# Patient Record
Sex: Male | Born: 1980 | Marital: Married | Smoking: Current every day smoker
Health system: Southern US, Community
[De-identification: ages and names within clinical notes are randomized; demographics above are authoritative.]

## PROBLEM LIST (undated history)

## (undated) DIAGNOSIS — I10 Essential (primary) hypertension: Secondary | ICD-10-CM

## (undated) DIAGNOSIS — G43909 Migraine, unspecified, not intractable, without status migrainosus: Secondary | ICD-10-CM

## (undated) DIAGNOSIS — F319 Bipolar disorder, unspecified: Secondary | ICD-10-CM

## (undated) DIAGNOSIS — F419 Anxiety disorder, unspecified: Secondary | ICD-10-CM

## (undated) HISTORY — DX: Migraine, unspecified, not intractable, without status migrainosus: G43.909

## (undated) HISTORY — DX: Anxiety disorder, unspecified: F41.9

## (undated) HISTORY — DX: Essential (primary) hypertension: I10

## (undated) HISTORY — DX: Bipolar disorder, unspecified: F31.9

---

## 2010-02-15 ENCOUNTER — Ambulatory Visit: Payer: Self-pay | Admitting: Family Medicine

## 2010-02-22 ENCOUNTER — Ambulatory Visit: Payer: Self-pay | Admitting: Family Medicine

## 2013-02-25 ENCOUNTER — Ambulatory Visit: Payer: Self-pay

## 2013-03-11 DIAGNOSIS — I10 Essential (primary) hypertension: Secondary | ICD-10-CM | POA: Insufficient documentation

## 2013-03-11 DIAGNOSIS — G43909 Migraine, unspecified, not intractable, without status migrainosus: Secondary | ICD-10-CM | POA: Insufficient documentation

## 2013-03-11 DIAGNOSIS — F319 Bipolar disorder, unspecified: Secondary | ICD-10-CM | POA: Insufficient documentation

## 2013-04-23 ENCOUNTER — Ambulatory Visit: Payer: Self-pay | Admitting: Family Medicine

## 2014-01-17 ENCOUNTER — Ambulatory Visit: Payer: Self-pay | Admitting: Physician Assistant

## 2014-05-09 ENCOUNTER — Ambulatory Visit: Payer: Self-pay | Admitting: Physician Assistant

## 2014-08-16 ENCOUNTER — Ambulatory Visit: Payer: Self-pay | Admitting: Internal Medicine

## 2014-09-01 ENCOUNTER — Ambulatory Visit: Payer: Self-pay | Admitting: Emergency Medicine

## 2014-12-20 ENCOUNTER — Ambulatory Visit: Payer: Self-pay

## 2014-12-20 DIAGNOSIS — G8929 Other chronic pain: Secondary | ICD-10-CM | POA: Insufficient documentation

## 2014-12-20 DIAGNOSIS — M549 Dorsalgia, unspecified: Principal | ICD-10-CM

## 2014-12-28 ENCOUNTER — Institutional Professional Consult (permissible substitution): Payer: Self-pay | Admitting: Specialist

## 2015-01-03 ENCOUNTER — Ambulatory Visit: Payer: Self-pay

## 2015-01-12 ENCOUNTER — Ambulatory Visit: Payer: Self-pay | Admitting: Licensed Clinical Social Worker

## 2015-03-16 IMAGING — CR DG RIBS 2V*R*
4 series · 4 of 4 positions shown · non-contrast
Comparison: None.

CLINICAL DATA: Fell down stairs

EXAM:
RIGHT RIBS - 2 VIEW

[chest pa]
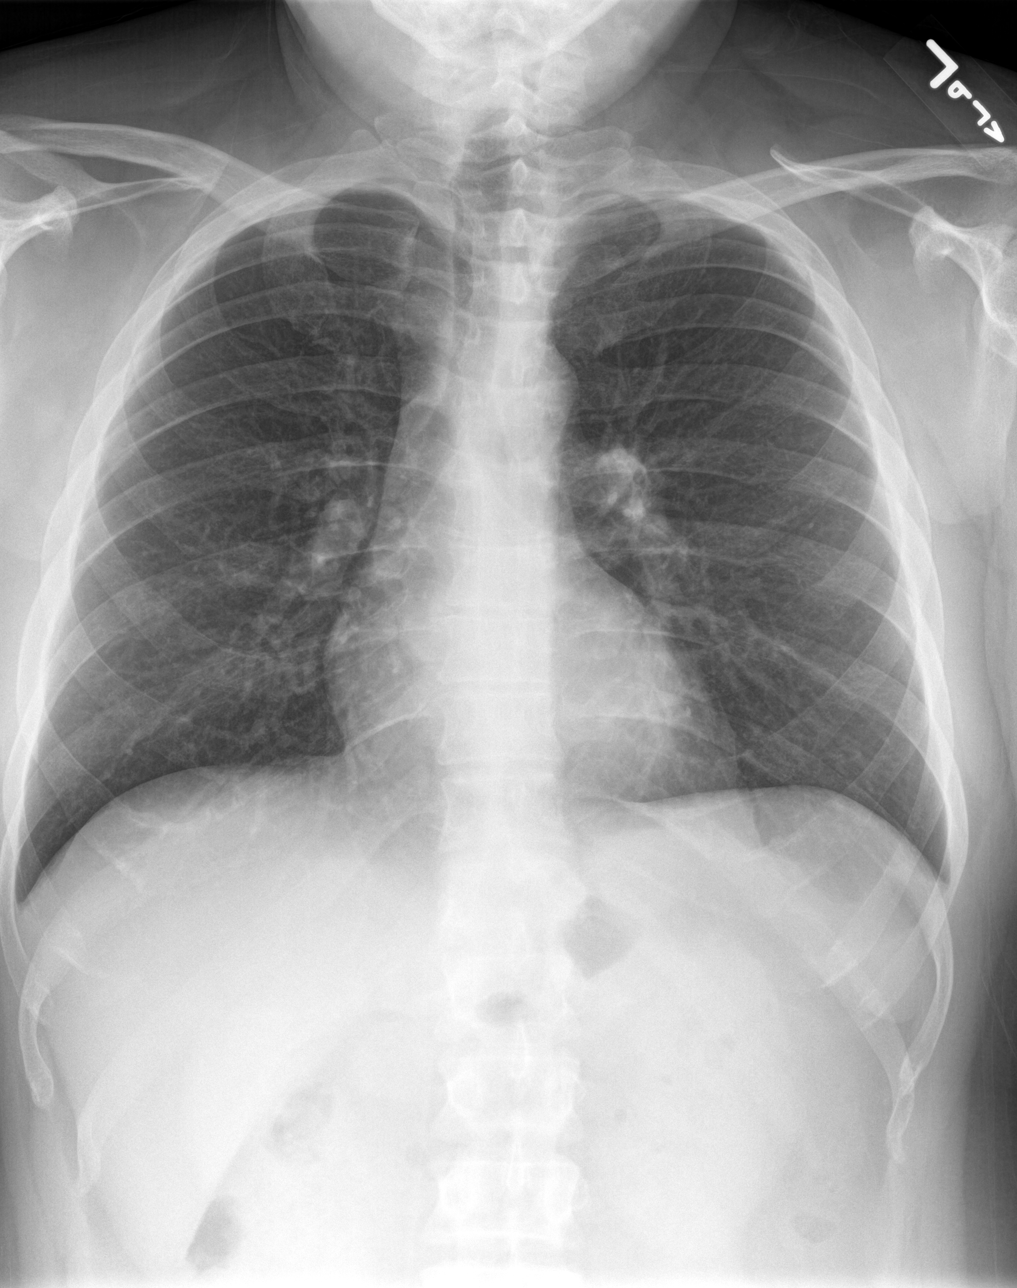

[rib ap (1 of 2)]
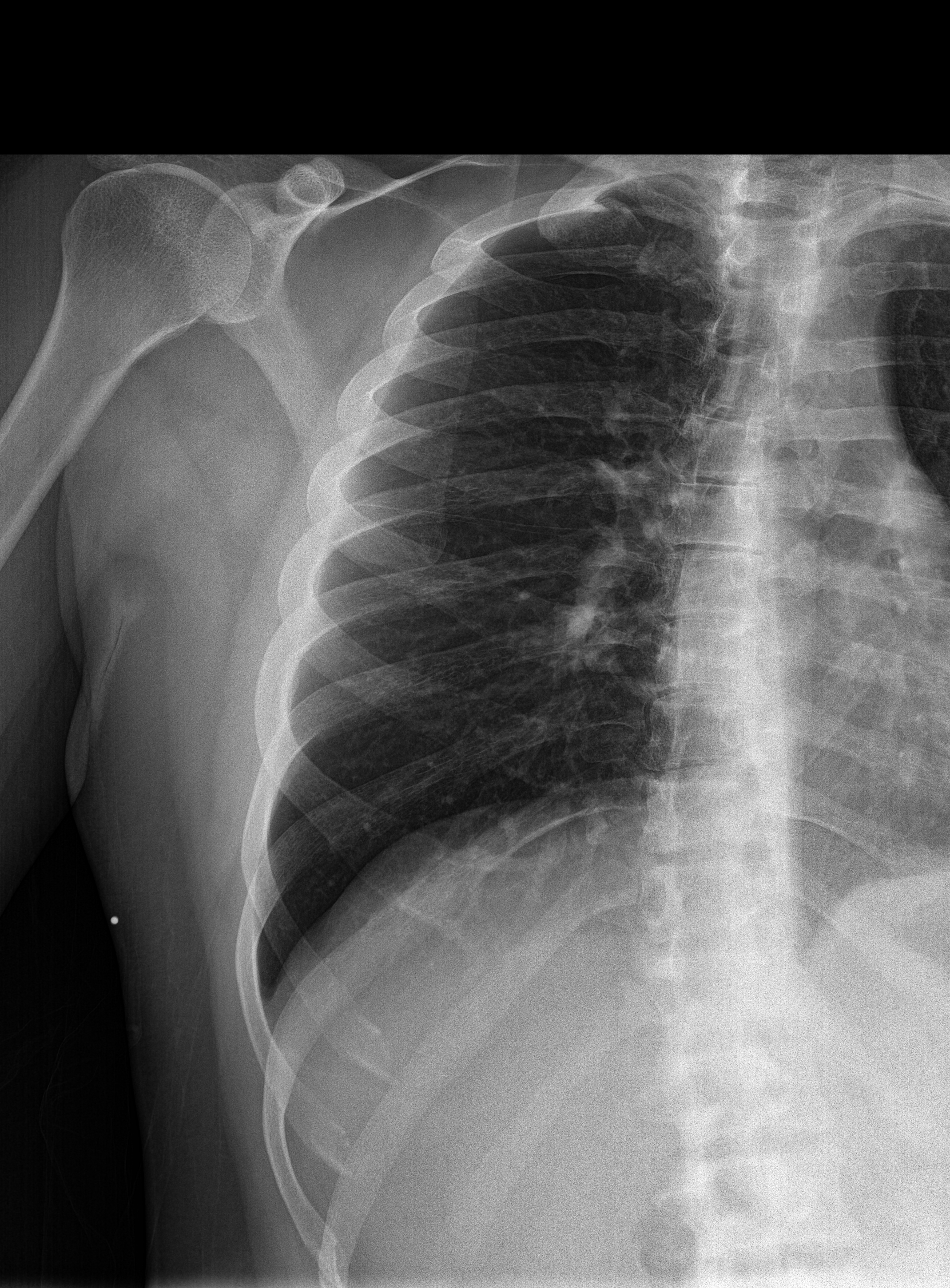

[rib obl]
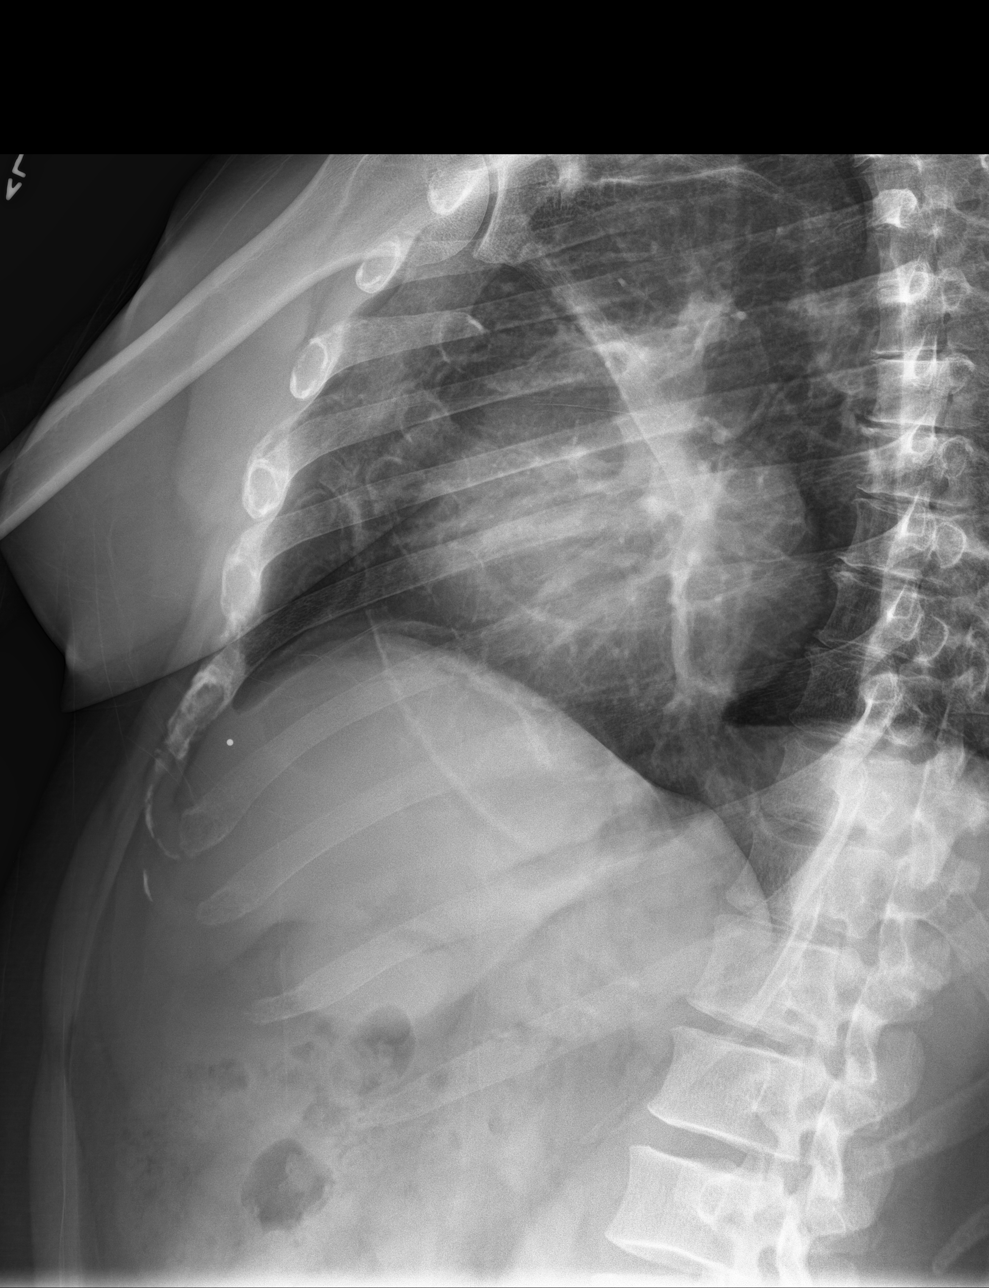

[rib ap (2 of 2)]
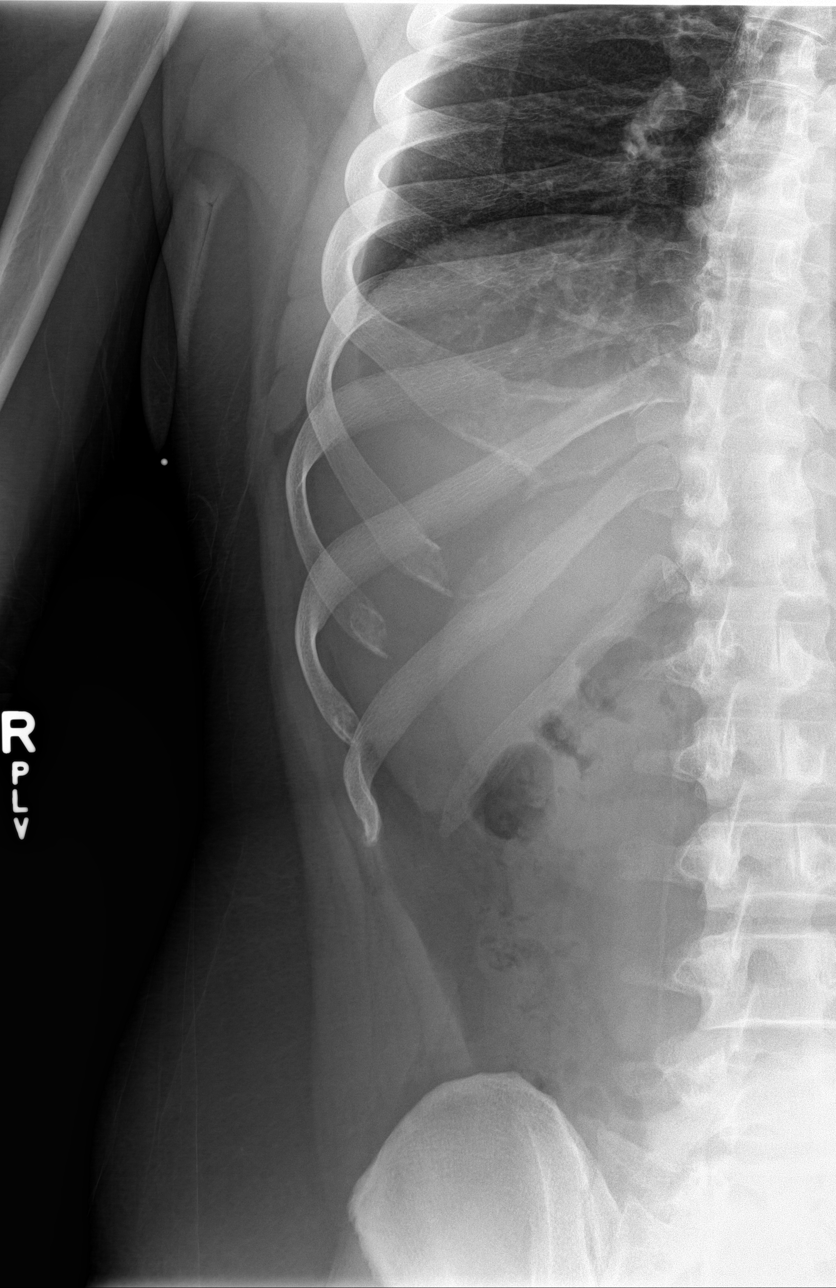

[4 of 4 positions shown; findings below may reference images not displayed]

FINDINGS: No fracture or other bone lesions are seen involving the ribs.
IMPRESSION: Negative.

## 2015-10-27 DIAGNOSIS — M549 Dorsalgia, unspecified: Principal | ICD-10-CM

## 2015-10-27 DIAGNOSIS — G8929 Other chronic pain: Secondary | ICD-10-CM

## 2018-06-03 ENCOUNTER — Encounter: Payer: Self-pay | Admitting: Emergency Medicine

## 2018-06-03 ENCOUNTER — Ambulatory Visit
Admission: EM | Admit: 2018-06-03 | Discharge: 2018-06-03 | Disposition: A | Discharge status: Home / Self Care | Payer: Self-pay | Attending: Family Medicine | Admitting: Family Medicine

## 2018-06-03 ENCOUNTER — Other Ambulatory Visit: Payer: Self-pay

## 2018-06-03 DIAGNOSIS — I10 Essential (primary) hypertension: Secondary | ICD-10-CM

## 2018-06-03 DIAGNOSIS — M549 Dorsalgia, unspecified: Secondary | ICD-10-CM

## 2018-06-03 MED ORDER — MELOXICAM 15 MG PO TABS: 15.0000 mg | ORAL_TABLET | Freq: Every day | ORAL | 0 refills | Status: AC | PRN

## 2018-06-03 MED ORDER — TIZANIDINE HCL 4 MG PO CAPS: 4.0000 mg | ORAL_CAPSULE | Freq: Three times a day (TID) | ORAL | 0 refills | Status: AC | PRN

## 2018-06-03 NOTE — ED Triage Notes (Signed)
Patient reports a constant pain for the past 3 days in his lower back that goes all the way up his back and into his neck and head.

## 2018-06-03 NOTE — Discharge Instructions (Signed)
Medications as prescribed.  See [place] [facility] regarding his vision. [phone]  Take care  Dr. Adriana Simasook

## 2018-06-03 NOTE — ED Provider Notes (Signed)
[facility]    CSN: [id number] Arrival date & time: 06/03/18  5621  History   Chief Complaint Chief Complaint  Patient presents with  . Back Pain  . Neck Pain  . Headache   HPI  37 year old male presents with the above complaints.  Patient reports chronic back pain.  States that his pain has been worse for the past 3 to 4 days.  He states that he is having thoracic back pain particular on the right side.  He states that it extends upwards and causes his neck to hurt.  Also comes with headache.  Patient endorsing visual changes and pain behind his eyes.  Patient has a history of migraine but states that this feels different.  No medications or interventions tried.  No known exacerbating factors.  Symptoms are severe.  No other complaints.  PMH, Surgical Hx, Family Hx, Social History reviewed and updated as below.  Past Medical History:  Diagnosis Date  . Anxiety   . Bipolar 1 disorder (HCC)   . Hypertension   . Migraines    Patient Active Problem List   Diagnosis Date Noted  . Chronic back pain 12/20/2014  . Bipolar 1 disorder (HCC) 03/11/2013  . BP (high blood pressure) 03/11/2013  . Headache, migraine 03/11/2013   Home Medications    Prior to Admission medications   Medication Sig Start Date End Date Taking? Authorizing Provider  meloxicam (MOBIC) 15 MG tablet Take 1 tablet (15 mg total) by mouth daily as needed. 06/03/18   Tommie Sams, DO  tiZANidine (ZANAFLEX) 4 MG capsule Take 1 capsule (4 mg total) by mouth 3 (three) times daily as needed for muscle spasms. 06/03/18   Tommie Sams, DO   Family History Family History  Problem Relation Age of Onset  . Stroke Mother    Social History Social History   Tobacco Use  . Smoking status: Current Every Day Smoker    Packs/day: 0.50    Types: Cigarettes  . Smokeless tobacco: Never Used  Substance Use Topics  . Alcohol use: No  . Drug use: Not on file    Allergies   Patient has no known  allergies.   Review of Systems Review of Systems  Eyes: Positive for visual disturbance.  Musculoskeletal: Positive for back pain and neck pain.  Neurological: Positive for headaches.    Physical Exam Triage Vital Signs ED Triage Vitals  Enc Vitals Group     BP 06/03/18 0942 (!) 134/94     Pulse Rate 06/03/18 0942 100     Resp 06/03/18 0942 16     Temp 06/03/18 0942 97.8 F (36.6 C)     Temp Source 06/03/18 0942 Oral     SpO2 06/03/18 0942 100 %     Weight 06/03/18 0940 140 lb (63.5 kg)     Height 06/03/18 0940 5\' 4"  (1.626 m)     Head Circumference --      Peak Flow --      Pain Score 06/03/18 0940 10     Pain Loc --      Pain Edu? --      Excl. in GC? --    No data found.  Updated Vital Signs BP (!) 134/94 (BP Location: Left Arm)   Pulse 100   Temp 97.8 F (36.6 C) (Oral)   Resp 16   Ht 5\' 4"  (1.626 m)   Wt 63.5 kg   SpO2 100%   BMI 24.03 kg/m  Visual Acuity Right Eye Distance:   Left Eye Distance:   Bilateral Distance:    Right Eye Near:   Left Eye Near:    Bilateral Near:     Physical Exam  Constitutional: He appears well-developed. No distress.  HENT:  Head: Normocephalic and atraumatic.  Cardiovascular: Normal rate and regular rhythm.  Pulmonary/Chest: Effort normal and breath sounds normal. He has no wheezes. He has no rales.  Musculoskeletal:  Paraspinal musculature (thoracic) and periscapular region with tenderness to palpation on the right. Very tight/spasmodic.   Neurological: He is alert.  Psychiatric:  Flat affect.  Depressed mood.  Nursing note and vitals reviewed.  UC Treatments / Results  Labs (all labs ordered are listed, but only abnormal results are displayed) Labs Reviewed - No data to display  EKG None  Radiology No results found.  Procedures Procedures (including critical care time)  Medications Ordered in UC Medications - No data to display  Initial Impression / Assessment and Plan / UC Course  I have  reviewed the triage vital signs and the nursing notes.  Pertinent labs & imaging results that were available during my care of the patient were reviewed by me and considered in my medical decision making (see chart for details).    37 year old male presents with musculoskeletal pain.  Treating with meloxicam and Zanaflex.  Advised to see [place] [facility] regarding visual complaints (needs thorough eye evaluation).  Final Clinical Impressions(s) / UC Diagnoses   Final diagnoses:  Acute right-sided back pain, unspecified back location     Discharge Instructions     Medications as prescribed.  See [place] [facility] regarding his vision. [phone]  Take care  Dr. Adriana Simasook    ED Prescriptions    Medication Sig Dispense Auth. Provider   meloxicam (MOBIC) 15 MG tablet Take 1 tablet (15 mg total) by mouth daily as needed. 30 tablet Dorenda Pfannenstiel G, DO   tiZANidine (ZANAFLEX) 4 MG capsule Take 1 capsule (4 mg total) by mouth 3 (three) times daily as needed for muscle spasms. 30 capsule Tommie Samsook, Ahijah Devery G, DO     Controlled Substance Prescriptions [place] Controlled Substance Registry consulted? Not Applicable   Tommie SamsCook, Remedy Corporan G, DO 06/03/18 1010
